# Patient Record
Sex: Female | Born: 1975 | Hispanic: Yes | Marital: Married | Smoking: Never smoker
Health system: Southern US, Community
[De-identification: ages and names within clinical notes are randomized; demographics above are authoritative.]

## PROBLEM LIST (undated history)

## (undated) DIAGNOSIS — Z789 Other specified health status: Secondary | ICD-10-CM

## (undated) HISTORY — PX: NO PAST SURGERIES: SHX2092

## (undated) HISTORY — PX: OTHER SURGICAL HISTORY: SHX169

---

## 2019-04-04 ENCOUNTER — Inpatient Hospital Stay (HOSPITAL_COMMUNITY): Payer: Self-pay

## 2019-04-04 ENCOUNTER — Other Ambulatory Visit: Payer: Self-pay

## 2019-04-04 ENCOUNTER — Inpatient Hospital Stay (HOSPITAL_COMMUNITY)
Admission: AD | Admit: 2019-04-04 | Discharge: 2019-04-05 | Disposition: A | Discharge status: Home / Self Care | Payer: Self-pay | Attending: Obstetrics and Gynecology | Admitting: Obstetrics and Gynecology

## 2019-04-04 ENCOUNTER — Encounter (HOSPITAL_COMMUNITY): Payer: Self-pay

## 2019-04-04 DIAGNOSIS — Z3A11 11 weeks gestation of pregnancy: Secondary | ICD-10-CM | POA: Insufficient documentation

## 2019-04-04 DIAGNOSIS — O4690 Antepartum hemorrhage, unspecified, unspecified trimester: Secondary | ICD-10-CM

## 2019-04-04 DIAGNOSIS — O209 Hemorrhage in early pregnancy, unspecified: Secondary | ICD-10-CM

## 2019-04-04 DIAGNOSIS — O09522 Supervision of elderly multigravida, second trimester: Secondary | ICD-10-CM | POA: Insufficient documentation

## 2019-04-04 DIAGNOSIS — O4691 Antepartum hemorrhage, unspecified, first trimester: Secondary | ICD-10-CM

## 2019-04-04 DIAGNOSIS — O200 Threatened abortion: Secondary | ICD-10-CM | POA: Insufficient documentation

## 2019-04-04 DIAGNOSIS — O3671X0 Maternal care for viable fetus in abdominal pregnancy, first trimester, not applicable or unspecified: Secondary | ICD-10-CM

## 2019-04-04 DIAGNOSIS — O3680X0 Pregnancy with inconclusive fetal viability, not applicable or unspecified: Secondary | ICD-10-CM

## 2019-04-04 HISTORY — DX: Other specified health status: Z78.9

## 2019-04-04 LAB — CBC
HCT: 36.8 % (ref 36.0–46.0)
Hemoglobin: 12.3 g/dL (ref 12.0–15.0)
MCH: 29.2 pg (ref 26.0–34.0)
MCHC: 33.4 g/dL (ref 30.0–36.0)
MCV: 87.4 fL (ref 80.0–100.0)
Platelets: 331 10*3/uL (ref 150–400)
RBC: 4.21 MIL/uL (ref 3.87–5.11)
RDW: 13.1 % (ref 11.5–15.5)
WBC: 10.1 10*3/uL (ref 4.0–10.5)
nRBC: 0 % (ref 0.0–0.2)

## 2019-04-04 LAB — WET PREP, GENITAL
Clue Cells Wet Prep HPF POC: NONE SEEN
Sperm: NONE SEEN
Trich, Wet Prep: NONE SEEN
Yeast Wet Prep HPF POC: NONE SEEN

## 2019-04-04 LAB — I-STAT BETA HCG BLOOD, ED (MC, WL, AP ONLY): I-stat hCG, quantitative: >2000 m[IU]/mL — ABNORMAL HIGH (ref <5)

## 2019-04-04 LAB — HCG, QUANTITATIVE, PREGNANCY: hCG, Beta Chain, Quant, S: 10354 m[IU]/mL — ABNORMAL HIGH (ref <5)

## 2019-04-04 LAB — HIV ANTIBODY (ROUTINE TESTING W REFLEX): HIV Screen 4th Generation wRfx: NONREACTIVE

## 2019-04-04 LAB — ABO/RH: ABO/RH(D): O POS

## 2019-04-04 MED ORDER — ACETAMINOPHEN 325 MG PO TABS: 650.0000 mg | ORAL_TABLET | Freq: Once | ORAL | Status: DC

## 2019-04-04 NOTE — Discharge Instructions (Signed)
Amenaza de aborto °Threatened Miscarriage ° °La amenaza de aborto se produce cuando una mujer tiene hemorragia vaginal durante las primeras 20 semanas de embarazo, pero el embarazo no se interrumpe. Si durante este período usted tiene hemorragia vaginal, el médico le hará pruebas para asegurarse de que el embarazo continúe. Si las pruebas muestran que usted continúa embarazada y que el "bebé" en desarrollo (feto) dentro del útero sigue creciendo, se considera que tuvo una amenaza de aborto. °La amenaza de aborto no implica que el embarazo vaya a terminar, pero sí aumenta el riesgo de perder el embarazo (aborto completo). °¿Cuáles son las causas? °Por lo general, se desconoce la causa de esta afección. Si el resultado final es el aborto completo, la causa más frecuente es la cantidad anormal de cromosomas del feto. Los cromosomas son las estructuras internas de las células que contienen todo el material genético de una persona. °¿Qué incrementa el riesgo? °Los siguientes factores del estilo de vida pueden aumentar el riesgo de aborto al comienzo del embarazo: °· Fumar. °· El consumo de cantidades excesivas de alcohol o cafeína. °· El consumo de drogas. °Las siguientes enfermedades preexistentes pueden aumentar el riesgo de aborto al comienzo del embarazo: °· Síndrome del ovario poliquístico. °· Fibromas uterinos. °· Infecciones. °· Diabetes mellitus. °¿Cuáles son los signos o los síntomas? °Los síntomas de esta afección incluyen los siguientes: °· Hemorragia vaginal. °· Dolor o cólicos abdominales leves. °¿Cómo se diagnostica? °Si tiene hemorragia con o sin dolor abdominal antes de las 20 semanas de embarazo, el médico le hará pruebas para determinar si el embarazo continúa. Estas incluirán lo siguiente: °· Ecografía. Este estudio usa ondas sonoras para crear imágenes del interior del útero. Esto permite que el médico vea al bebé en gestación y otras estructuras, como la placenta. °· Examen pélvico. Este es un examen  interno de la vagina y del cuello uterino. °· Medición de la frecuencia cardíaca del feto. °· Pruebas de laboratorio, como análisis de sangre, análisis de orina o hisopados para detectar una infección. °Es posible que le diagnostiquen una amenaza de aborto en los siguientes casos: °· La ecografía muestra que el embarazo continúa. °· La frecuencia cardíaca del feto es alta. °· El examen pélvico muestra que la apertura entre el útero y la vagina (cuello uterino) está cerrada. °· Los análisis de sangre confirman que el embarazo continúa. °¿Cómo se trata? °No se ha demostrado que ningún tratamiento evite que una amenaza de aborto se convierta en un aborto completo. Sin embargo, los cuidados adecuados en el hogar son importantes. °Siga estas indicaciones en su casa: °· Descanse lo suficiente. °· No tenga relaciones sexuales ni use tampones si tiene hemorragia vaginal. °· No se haga duchas vaginales. °· No fume ni consuma drogas. °· No beba alcohol. °· Evite la cafeína. °· Vaya a todas las visitas de control prenatales y de control como se lo haya indicado el médico. Esto es importante. °Comuníquese con un médico si: °· Tiene una ligera hemorragia o manchado vaginal durante el embarazo. °· Tiene dolor o cólicos en el abdomen. °· Tiene fiebre. °Solicite ayuda de inmediato si: °· Tiene una hemorragia vaginal abundante. °· Elimina coágulos de sangre por la vagina. °· Elimina tejidos por la vagina. °· Tiene una pérdida de líquido o le sale líquido a chorros por la vagina. °· Siente dolor en la parte baja de la espalda o cólicos abdominales intensos. °· Tiene fiebre, escalofríos y dolor abdominal intenso. °Resumen °· La amenaza de aborto se produce cuando una mujer tiene hemorragia   vaginal durante las primeras 20semanas de [place], pero el embarazo no se interrumpe.  Por lo general, no se conoce la causa de la amenaza de aborto.  Entre los sntomas de esta afeccin, se incluyen hemorragia vaginal y clicos o dolor  abdominal leve.  No se ha demostrado que ningn tratamiento evite que una amenaza de aborto se [place] en un aborto completo.  Vaya a todas las visitas de control prenatales y de control como se lo haya indicado el mdico. Esto es importante. Esta informacin no tiene Marine scientist el consejo del mdico. Asegrese de hacerle al mdico cualquier pregunta que tenga. Document Released: 01/25/2005 Document Revised: 01/05/2017 Document Reviewed: 01/05/2017 Elsevier Patient Education  2020 [employer].

## 2019-04-04 NOTE — MAU Provider Note (Signed)
History     CSN: [id number]  Arrival date and time: 04/04/19 1750   First Provider Initiated Contact with Patient 04/04/19 2056      Chief Complaint  Patient presents with  . Vaginal Bleeding   Tammy Underwood is a 43 y.o. G6P5005 at 11 weeks by Definite LMP of Sept 18, 2020.  She presents today for Vaginal Bleeding that started around 5pm.  She states it is not a lot of bleeding, but it is bright red and without clots.  She endorses lower abdominal pain that is intermittent in nature.  She endorses having a positive pregnancy test at home yesterday. She denies sexual activity in the last 3 days or vaginal discharge prior to the bleeding.      OB History    Gravida  6   Para  5   Term  5   Preterm  0   AB  0   Living  5     SAB  0   TAB  0   Ectopic  0   Multiple  0   Live Births  5           Past Medical History:  Diagnosis Date  . Medical history non-contributory     Past Surgical History:  Procedure Laterality Date  . NO PAST SURGERIES      No family history on file.  Social History   Tobacco Use  . Smoking status: Never Smoker  . Smokeless tobacco: Never Used  Substance Use Topics  . Alcohol use: Never    Frequency: Never  . Drug use: Never    Allergies: No Known Allergies  Medications Prior to Admission  Medication Sig Dispense Refill Last Dose  . Prenatal Vit-Fe Fumarate-FA (PRENATAL MULTIVITAMIN) TABS tablet Take 1 tablet by mouth daily at 12 noon.   04/04/2019 at Unknown time    Review of Systems  Constitutional: Negative for chills and fever.  Respiratory: Negative for cough and shortness of breath.   Gastrointestinal: Positive for abdominal pain. Negative for constipation, diarrhea, nausea and vomiting.  Genitourinary: Positive for vaginal bleeding. Negative for difficulty urinating, dysuria, pelvic pain and vaginal discharge.  Musculoskeletal: Negative for back pain.  Neurological: Positive for headaches (5/10).  Negative for dizziness and light-headedness.   Physical Exam   Blood pressure 104/65, pulse 89, temperature 98.6 F (37 C), temperature source Oral, resp. rate 16, height 5' (1.524 m), weight 57 kg, last menstrual period 01/17/2019, SpO2 100 %.  Physical Exam  Vitals reviewed. Constitutional: She is oriented to person, place, and time. She appears well-developed and well-nourished.  HENT:  Head: Normocephalic and atraumatic.  Eyes: Conjunctivae are normal.  Neck: Normal range of motion.  Cardiovascular: Normal rate.  Respiratory: Effort normal.  GI: Soft.  Genitourinary:    Vaginal bleeding present.  There is bleeding in the vagina.    Genitourinary Comments: Speculum Exam: -Normal External Genitalia: Non tender, Blood noted on labia and perineum.  Active bleeding from introitus noted -Vaginal Vault:  Abundant amt of large (lemon sized) clots removed with forceps and with patient performing valsalva maneuver x 3. Allowing for visualizing of Pink mucosa with some notable vaginal wall prolapse-wet prep collected -Cervix:Pink, no lesions, cysts, or polyps.  Appears slightly open. Scant amt active bleeding from os-GC/CT collected -Bimanual Exam:  Deferred    Musculoskeletal: Normal range of motion.        General: No edema.  Neurological: She is alert and oriented to person, place, and time.  Skin: Skin is warm and dry.  Psychiatric: She has a normal mood and affect. Her behavior is normal.    MAU Course  Procedures Results for orders placed or performed during the hospital encounter of 04/04/19 (from the past 24 hour(s))  I-Stat Beta hCG blood, ED ([facility], [facility], [facility] only)     Status: Abnormal   Collection Time: 04/04/19  6:53 PM  Result Value Ref Range   I-stat hCG, quantitative >2,000.0 (H) <5 mIU/mL   Comment 3          hCG, quantitative, pregnancy     Status: Abnormal   Collection Time: 04/04/19  8:42 PM  Result Value Ref Range   hCG, Beta Chain, Quant, S 10,354 (H) <5 mIU/mL   CBC     Status: None   Collection Time: 04/04/19  8:42 PM  Result Value Ref Range   WBC 10.1 4.0 - 10.5 K/uL   RBC 4.21 3.87 - 5.11 MIL/uL   Hemoglobin 12.3 12.0 - 15.0 g/dL   HCT 38.9 37.3 - 42.8 %   MCV 87.4 80.0 - 100.0 fL   MCH 29.2 26.0 - 34.0 pg   MCHC 33.4 30.0 - 36.0 g/dL   RDW 76.8 11.5 - 72.6 %   Platelets 331 150 - 400 K/uL   nRBC 0.0 0.0 - 0.2 %  ABO/Rh     Status: None   Collection Time: 04/04/19  8:42 PM  Result Value Ref Range   ABO/RH(D) O POS    No rh immune globuloin      NOT A RH IMMUNE GLOBULIN CANDIDATE, PT RH POSITIVE Performed at [facility], [address], [place], [place] [address]   HIV Antibody (routine testing w rflx)     Status: None   Collection Time: 04/04/19  8:42 PM  Result Value Ref Range   HIV Screen 4th Generation wRfx NON REACTIVE NON REACTIVE  Wet prep, genital     Status: Abnormal   Collection Time: 04/04/19  9:24 PM  Result Value Ref Range   Yeast Wet Prep HPF POC NONE SEEN NONE SEEN   Trich, Wet Prep NONE SEEN NONE SEEN   Clue Cells Wet Prep HPF POC NONE SEEN NONE SEEN   WBC, Wet Prep HPF POC FEW (A) NONE SEEN   Sperm NONE SEEN    US Ob Less Than 14 Weeks With Ob Transvaginal  Result Date: 04/04/2019 CLINICAL DATA:  Initial evaluation for acute vaginal bleeding, early pregnancy. EXAM: OBSTETRIC <14 WK [place] AND TRANSVAGINAL OB US TECHNIQUE: Both transabdominal and transvaginal ultrasound examinations were performed for complete evaluation of the gestation as well as the maternal uterus, adnexal regions, and pelvic cul-de-sac. Transvaginal technique was performed to assess early pregnancy. COMPARISON:  None. FINDINGS: Intrauterine gestational sac: Present. Yolk sac:  Present. Embryo:  Present. Cardiac Activity: Negative. Heart Rate: N/A  bpm CRL: 3.74 mm   6 w   0 d                  [place] EDC: 11/28/2019 Subchorionic hemorrhage:  None visualized. Maternal uterus/adnexae: Ovaries are normal in appearance bilaterally. Corpus luteal cyst  noted on the left. No adnexal mass or free fluid. IMPRESSION: 1. Single intrauterine gestational sac with internal yolk sac and embryo, but no cardiac activity yet visualized. Crown-rump length measures 3.7 mm. Findings are suspicious but not yet definitive for failed pregnancy. Recommend follow-up US in 10-14 days for definitive diagnosis. This recommendation follows SRU consensus guidelines: Diagnostic Criteria for  Nonviable Pregnancy Early in the First Trimester. Malva Limes Engl J Med 2013; 161:0960-45; 369:1443-51. 2. No other acute maternal uterine or adnexal abnormality identified. Electronically Signed   By: Rise MuBenjamin  McClintock M.D.   On: 04/04/2019 22:56    MDM Pelvic Exam; Wet Prep and GC/CT Labs: UA, UPT, CBC, hCG, ABO Ultrasound Pain Medication Assessment and Plan  43 year old G6P5005 at 11 weeks Vaginal Bleeding Headache  -Reviewed POC with patient. -Exam performed and findings discussed.  -Informed that several large clots removed from vagina and one area that is suspicious for POC.  -Contents sent to pathology for formal evaluation. -Offered and accepts pain medication for HA.  Will give tylenol now. -Patient to US to evaluate uterine contents. -Will reassess once final results return.  -Interpretations completed with assistance of in-house interpreter-Viria Onalee HuaAlvarez.    Cherre RobinsJessica L Laveyah Oriol, MSN, CNM 04/04/2019, 8:56 PM   Reassessment (11:29 PM) SIUP at 6.0 without Cardiac Activity Threatened Abortion  -US results reveal IUP without cardiac activity. -Wet prep negative. -Surgical Pathology evaluation cancelled.  -In room to discuss results with patient. -Patient informed that findings not c/w dating based on definite LMP.  -Patient questions if fetus remains in place and informed that embryo seen, but bleeding is suspicious for threatened abortion.  -Discussed need for follow up US in one week to determine fetal viability.  -Bleeding Precautions given. -Patient instructed to take tylenol for  pain if needed. -Instructed to cancel scheduled appt for [facility] on Monday until follow up ultrasound is performed.  -Patient verbalizes understanding and had no questions or concerns. -US ordered. -Encouraged to call or return to MAU if symptoms worsen or with the onset of new symptoms. -Discharged to home in stable condition. -Interpretatations completed with assistance of [place] [phone].  Cherre RobinsJessica L Bevin Mayall MSN, CNM Advanced Practice Provider, [facility]

## 2019-04-04 NOTE — MAU Note (Signed)
Pt transferred from ED. Pt reports she started having vaginal bleeding today around 1730 with lower abdominal pain. No clots or tissue. Has changed pad only 1 time. Has not taken anything for pain. No recent intercourse or vaginal exams. Has appointment on Monday with [facility]. LMP 01/17/2019. In waiting room, patient sitting on chux pad with some bright red vaginal blood noted.

## 2019-04-07 LAB — GC/CHLAMYDIA PROBE AMP (~~LOC~~) NOT AT ARMC
Chlamydia: NEGATIVE
Comment: NEGATIVE
Comment: NORMAL
Neisseria Gonorrhea: NEGATIVE

## 2019-04-14 ENCOUNTER — Ambulatory Visit (INDEPENDENT_AMBULATORY_CARE_PROVIDER_SITE_OTHER): Payer: Self-pay | Admitting: Obstetrics and Gynecology

## 2019-04-14 ENCOUNTER — Other Ambulatory Visit: Payer: Self-pay

## 2019-04-14 ENCOUNTER — Ambulatory Visit (HOSPITAL_COMMUNITY)
Admission: RE | Admit: 2019-04-14 | Discharge: 2019-04-14 | Disposition: A | Discharge status: Home / Self Care | Payer: Self-pay | Source: Ambulatory Visit

## 2019-04-14 ENCOUNTER — Encounter: Payer: Self-pay | Admitting: Obstetrics and Gynecology

## 2019-04-14 DIAGNOSIS — O09521 Supervision of elderly multigravida, first trimester: Secondary | ICD-10-CM

## 2019-04-14 DIAGNOSIS — O3680X0 Pregnancy with inconclusive fetal viability, not applicable or unspecified: Secondary | ICD-10-CM | POA: Insufficient documentation

## 2019-04-14 DIAGNOSIS — O021 Missed abortion: Secondary | ICD-10-CM | POA: Insufficient documentation

## 2019-04-14 DIAGNOSIS — Z789 Other specified health status: Secondary | ICD-10-CM

## 2019-04-14 DIAGNOSIS — O039 Complete or unspecified spontaneous abortion without complication: Secondary | ICD-10-CM

## 2019-04-14 DIAGNOSIS — Z5189 Encounter for other specified aftercare: Secondary | ICD-10-CM

## 2019-04-14 MED ORDER — WOMENS MULTI PO CAPS: 1.0000 | ORAL_CAPSULE | Freq: Every day | ORAL | Status: AC

## 2019-04-14 NOTE — Progress Notes (Signed)
Obstetrics and Gynecology Visit Return Patient Evaluation  Appointment Date: 04/14/2019  Primary Care Provider: Patient, No Pcp Per  OBGYN Clinic: Center for [facility]  Chief Complaint: follow up SAB  History of Present Illness:  Tammy Underwood is a 43 y.o. [phone] with above CC. Patient seen in MAU for VB on 12/4. Quant was 10,354 and u/s showed YS with CRL 3.41mm but no FHR; pt is O POS   U/s today showed nothing in uterus. She does states her bleeding stopped about 2 days ago and she did have to wear a pad or tampon until bleeding stopped.   Review of Systems:  as noted in the History of Present Illness.  Medications: None Allergies: has No Known Allergies.  Physical Exam:  LMP 01/17/2019  There is no height or weight on file to calculate BMI. General appearance: Well nourished, well developed female in no acute distress.  Neuro/Psych:  Normal mood and affect.    CLINICAL DATA:  Pregnancy of inconclusive fetal viability  EXAM: TRANSVAGINAL OB ULTRASOUND  TECHNIQUE: Transvaginal ultrasound was performed for complete evaluation of the gestation as well as the maternal uterus, adnexal regions, and pelvic cul-de-sac.  COMPARISON:  04/04/2019  FINDINGS: Intrauterine gestational sac: Absent; gestational sac seen on the previous exam no longer identified.  Yolk sac:  N/A  Embryo:  N/A  Cardiac Activity: N/A  Heart Rate: N/A bpm  MSD:   mm    w     d  CRL:     mm    w  d                  [place] EDC:  Subchorionic hemorrhage:  N/A  Maternal uterus/adnexae:  Previously identified gestational sac no longer seen.  Small nabothian cysts at cervix.  Minimal fluid within endometrial canal, which is otherwise normal in appearance.  Anteverted uterus without mass.  LEFT ovary normal size and morphology, 4.0 x 1.7 x 1.9 cm, demonstrating a dominant follicle.  RIGHT ovary measures 2.1 x 2.7 x 2.1 cm, normal appearance.  No free  pelvic fluid or adnexal masses.  IMPRESSION: Previously identified gestational sac is no longer seen consistent with spontaneous abortion.  Minimal nonspecific fluid within endometrial canal.   Electronically Signed   By: Ulyses Southward M.D.   On: 04/14/2019 15:37  CLINICAL DATA:  Initial evaluation for acute vaginal bleeding, early pregnancy.  EXAM: OBSTETRIC <14 WK [place] AND TRANSVAGINAL OB US  TECHNIQUE: Both transabdominal and transvaginal ultrasound examinations were performed for complete evaluation of the gestation as well as the maternal uterus, adnexal regions, and pelvic cul-de-sac. Transvaginal technique was performed to assess early pregnancy.  COMPARISON:  None.  FINDINGS: Intrauterine gestational sac: Present.  Yolk sac:  Present.  Embryo:  Present.  Cardiac Activity: Negative.  Heart Rate: N/A  bpm  CRL: 3.74 mm   6 w   0 d                  [place] EDC: 11/28/2019  Subchorionic hemorrhage:  None visualized.  Maternal uterus/adnexae: Ovaries are normal in appearance bilaterally. Corpus luteal cyst noted on the left. No adnexal mass or free fluid.  IMPRESSION: 1. Single intrauterine gestational sac with internal yolk sac and embryo, but no cardiac activity yet visualized. Crown-rump length measures 3.7 mm. Findings are suspicious but not yet definitive for failed pregnancy. Recommend follow-up US in 10-14 days for definitive diagnosis. This recommendation follows SRU consensus guidelines: Diagnostic Criteria for Nonviable Pregnancy  Early in the First Trimester. [place] Med 2013; 947:0962-83. 2. No other acute maternal uterine or adnexal abnormality identified.   Electronically Signed   By: Jeannine Boga M.D.   On: 04/04/2019 22:56 Assessment: s/p SAB  Plan:  1. Multigravida of advanced maternal age in first trimester Pt unsure if wants to try again or wants BC. D/w her should expect a period in about 4-6wks and if not to  call us and I recommend condoms or waiting to try again until next period for easier dating. D/w her cause most likely is due to [place] no need to do anything for work up.   2. Follow-up visit after miscarriage  Interpreter used  RTC: PRN  Durene Romans MD Attending Center for [facility])

## 2020-07-13 ENCOUNTER — Encounter (HOSPITAL_COMMUNITY): Payer: Self-pay | Admitting: Emergency Medicine

## 2020-07-13 ENCOUNTER — Other Ambulatory Visit: Payer: Self-pay

## 2020-07-13 ENCOUNTER — Emergency Department (HOSPITAL_COMMUNITY): Payer: Self-pay

## 2020-07-13 ENCOUNTER — Emergency Department (HOSPITAL_COMMUNITY)
Admission: EM | Admit: 2020-07-13 | Discharge: 2020-07-13 | Disposition: A | Discharge status: Home / Self Care | Payer: Self-pay | Attending: Emergency Medicine | Admitting: Emergency Medicine

## 2020-07-13 DIAGNOSIS — W010XXA Fall on same level from slipping, tripping and stumbling without subsequent striking against object, initial encounter: Secondary | ICD-10-CM | POA: Insufficient documentation

## 2020-07-13 DIAGNOSIS — Y990 Civilian activity done for income or pay: Secondary | ICD-10-CM | POA: Insufficient documentation

## 2020-07-13 DIAGNOSIS — S52502A Unspecified fracture of the lower end of left radius, initial encounter for closed fracture: Secondary | ICD-10-CM | POA: Insufficient documentation

## 2020-07-13 NOTE — Discharge Instructions (Addendum)
Recomiendo una combinacin de tylenol e ibuprofeno para Physicist, medical. Puede tomar una dosis baja de ambos al [employer]. Recomiendo 325 mg de Tylenol combinados con 400 mg de ibuprofeno. Este es un Tylenol regular y dos ibuprofeno regulares. Puede tomar estos 2-3 veces al da para Agricultural consultant. Intente tomar estos medicamentos con una pequea cantidad de alimentos para Acupuncturist.  Adems, considere cremas tpicas para Psychologist, counselling Gel, BioFreeze o [place]. Tambin hay una crema para aliviar el dolor hecha por [place]. Debera poder encontrar todos estos en su farmacia local.  Aplique hielo en la mueca izquierda. Esto ayuda tanto con el dolor como con la hinchazn.  Mantenga la frula intacta en la mueca izquierda hasta que haga un seguimiento con el mdico de manos local. Su nmero de telfono est abajo. Su nombre es Dr. Amanda Pea.  Si sus sntomas empeoran, regrese al departamento de emergencias. Fue Curator.

## 2020-07-13 NOTE — Progress Notes (Signed)
Orthopedic Tech Progress Note Patient Details:  Tammy Underwood 1975/07/14 [id number]  Ortho Devices Type of Ortho Device: Sugartong splint,Arm sling Ortho Device/Splint Location: LUE Ortho Device/Splint Interventions: Ordered,Application   Post Interventions Patient Tolerated: Well Instructions Provided: Care of device   Donald Pore 07/13/2020, 7:27 PM

## 2020-07-13 NOTE — ED Triage Notes (Signed)
Pt slipped and fell at work around 2:30 pm.  Reports L wrist pain.  CMS intact. Went home and took ibuprofen and made a sling for her arm.  Pain hasn't improved.

## 2020-07-13 NOTE — ED Notes (Signed)
Patient verbalized understanding of discharge instructions. Opportunity for questions and answers.  

## 2020-07-13 NOTE — ED Provider Notes (Signed)
[facility] EMERGENCY DEPARTMENT Provider Note   CSN: [id number] Arrival date & time: 07/13/20  1655     History Chief Complaint  Patient presents with  . Wrist Pain    Tammy Underwood is a 45 y.o. female.  HPI Patient is a 45 year old female who presents the emergency department due to left wrist pain.  She is left-hand dominant.  History obtained Via Spanish interpreter.  Patient states she was at work 2 days ago and slipped and fell on an outstretched left hand.  She reports mild wrist pain that has worsened with associated moderate swelling circumferentially.  Pain worsens with any movement of the wrist and radiates down her hand as well as upper left arm.  She denies any hand pain, elbow pain, or shoulder pain.  Denies any numbness, tingling, or weakness.  She states she took ibuprofen at home and made a homemade sling for her arm.  No other complaints at this time.    Past Medical History:  Diagnosis Date  . Medical history non-contributory     Patient Active Problem List   Diagnosis Date Noted  . Missed ab 04/14/2019  . Multigravida of advanced maternal age in first trimester 04/14/2019    Past Surgical History:  Procedure Laterality Date  . NO PAST SURGERIES       OB History    Gravida  6   Para  5   Term  5   Preterm  0   AB  1   Living  5     SAB  1   IAB  0   Ectopic  0   Multiple  0   Live Births  5           No family history on file.  Social History   Tobacco Use  . Smoking status: Never Smoker  . Smokeless tobacco: Never Used  Substance Use Topics  . Alcohol use: Never  . Drug use: Never    Home Medications Prior to Admission medications   Medication Sig Start Date End Date Taking? Authorizing Provider  Multiple Vitamins-Minerals (WOMENS MULTI) CAPS Take 1 capsule by mouth daily. 04/14/19   Seward Bing, MD  Prenatal Vit-Fe Fumarate-FA (PRENATAL MULTIVITAMIN) TABS tablet Take 1 tablet by mouth  daily at 12 noon.    [provider]    Allergies    Patient has no known allergies.  Review of Systems   Review of Systems  All other systems reviewed and are negative. Ten systems reviewed and are negative for acute change, except as noted in the HPI.   Physical Exam Updated Vital Signs BP 118/89 (BP Location: Right Arm)   Pulse 81   Temp 99.1 F (37.3 C)   Resp 16   LMP 06/16/2020   SpO2 100%   Physical Exam Vitals and nursing note reviewed.  Constitutional:      General: She is not in acute distress.    Appearance: Normal appearance. She is not ill-appearing, toxic-appearing or diaphoretic.  HENT:     Head: Normocephalic and atraumatic.     Right Ear: External ear normal.     Left Ear: External ear normal.     Nose: Nose normal.     Mouth/Throat:     Mouth: Mucous membranes are moist.     Pharynx: Oropharynx is clear. No oropharyngeal exudate or posterior oropharyngeal erythema.  Eyes:     Extraocular Movements: Extraocular movements intact.  Cardiovascular:     Rate  and Rhythm: Normal rate and regular rhythm.     Pulses: Normal pulses.     Heart sounds: No gallop.   Pulmonary:     Effort: Pulmonary effort is normal.  Abdominal:     General: Abdomen is flat. There is no distension.  Musculoskeletal:        General: Swelling and tenderness present. Normal range of motion.     Cervical back: Normal range of motion and neck supple. No tenderness.     Comments: Moderate tenderness noted along the volar aspect of the left wrist.  Mild edema noted circumferentially around the left wrist.  Unable to assess range of motion due to pain.  Patient has full range of motion of all the fingers of the left hand, left elbow, as well as the left shoulder.  Distal sensation intact.  Good cap refill.  2+ radial pulses.  Soft compartments.  Skin:    General: Skin is warm and dry.  Neurological:     General: No focal deficit present.     Mental Status: She is alert and  oriented to person, place, and time.  Psychiatric:        Mood and Affect: Mood normal.        Behavior: Behavior normal.    ED Results / Procedures / Treatments   Labs (all labs ordered are listed, but only abnormal results are displayed) Labs Reviewed - No data to display  EKG None  Radiology DG Wrist Complete Left  Result Date: 07/13/2020 CLINICAL DATA:  Status post fall. EXAM: LEFT WRIST - COMPLETE 3+ VIEW COMPARISON:  None. FINDINGS: Acute fracture is seen involving the distal left radius. There is no evidence of dislocation. There is no evidence of arthropathy or other focal bone abnormality. Mild diffuse soft tissue swelling is seen. IMPRESSION: Acute fracture of the distal left radius. Electronically Signed   By: Aram Candela M.D.   On: 07/13/2020 17:53    Procedures Procedures   Medications Ordered in ED Medications - No data to display  ED Course  I have reviewed the triage vital signs and the nursing notes.  Pertinent labs & imaging results that were available during my care of the patient were reviewed by me and considered in my medical decision making (see chart for details).    MDM Rules/Calculators/A&P                          Pt is a 45 y.o. female who presents the emergency department due to a fracture of the left wrist.  Imaging: X-ray of the left wrist shows acute fracture of the distal left radius.  I, Placido Sou, PA-C, personally reviewed and evaluated these images and lab results as part of my medical decision-making.  Physical exam is reassuring.  She is neurovascularly intact in the left hand.  Full range of motion of the left hand, left elbow, as well as left shoulder.  Grip strength is intact.  Strong radial pulses.  Soft compartments.  Will place patient in a sugar tong splint, sling, and give her follow-up with hand surgery.  Recommended that she call them tomorrow to schedule a follow-up appointment.  We discussed return precautions.   Tylenol and ibuprofen for continued pain management.  Her questions were answered and she was amicable at the time of discharge.  Note: Portions of this report may have been transcribed using voice recognition software. Every effort was made to ensure accuracy; however, inadvertent  computerized transcription errors may be present.   Final Clinical Impression(s) / ED Diagnoses Final diagnoses:  Closed fracture of distal end of left radius, unspecified fracture morphology, initial encounter   Rx / DC Orders ED Discharge Orders    None       Placido Sou, PA-C 07/13/20 1829    Milagros Loll, MD 07/16/20 [phone]

## 2021-02-08 ENCOUNTER — Other Ambulatory Visit: Payer: Self-pay | Admitting: Obstetrics & Gynecology

## 2021-02-08 DIAGNOSIS — Z1231 Encounter for screening mammogram for malignant neoplasm of breast: Secondary | ICD-10-CM

## 2021-02-24 ENCOUNTER — Inpatient Hospital Stay: Admission: RE | Admit: 2021-02-24 | Payer: Self-pay | Source: Ambulatory Visit

## 2021-02-24 ENCOUNTER — Ambulatory Visit (INDEPENDENT_AMBULATORY_CARE_PROVIDER_SITE_OTHER): Payer: Self-pay

## 2021-02-24 ENCOUNTER — Other Ambulatory Visit: Payer: Self-pay

## 2021-02-24 ENCOUNTER — Ambulatory Visit
Admission: RE | Admit: 2021-02-24 | Discharge: 2021-02-24 | Disposition: A | Discharge status: Home / Self Care | Payer: No Typology Code available for payment source | Source: Ambulatory Visit | Attending: Obstetrics & Gynecology | Admitting: Obstetrics & Gynecology

## 2021-02-24 DIAGNOSIS — Z3202 Encounter for pregnancy test, result negative: Secondary | ICD-10-CM

## 2021-02-24 DIAGNOSIS — Z1231 Encounter for screening mammogram for malignant neoplasm of breast: Secondary | ICD-10-CM

## 2021-02-25 LAB — POCT PREGNANCY, URINE: Preg Test, Ur: NEGATIVE

## 2022-04-15 IMAGING — CR DG WRIST COMPLETE 3+V*L*
4 series · 4 of 4 positions shown · non-contrast
Comparison: None.

CLINICAL DATA: Status post fall.

EXAM:
LEFT WRIST - COMPLETE 3+ VIEW

[wrist pa]
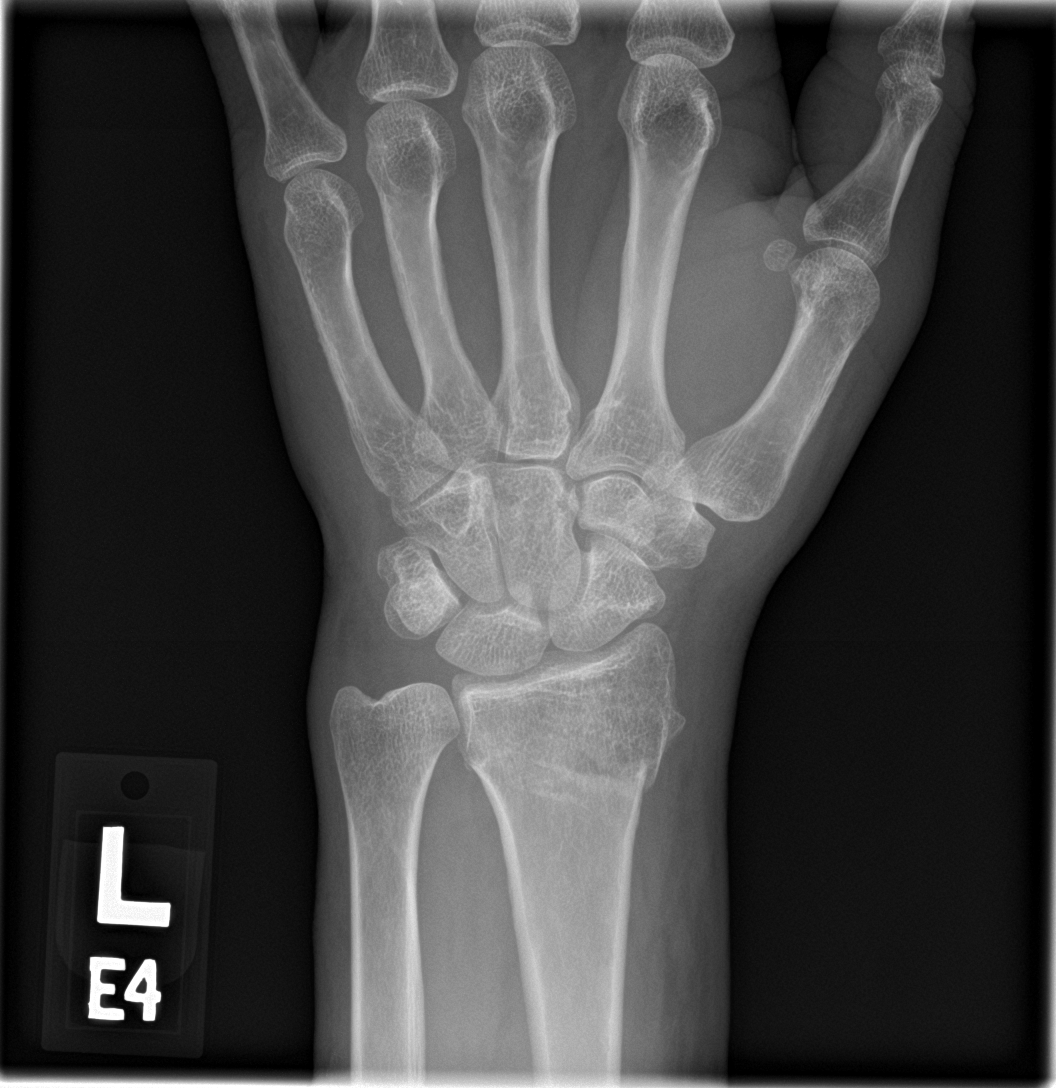

[wrist obl]
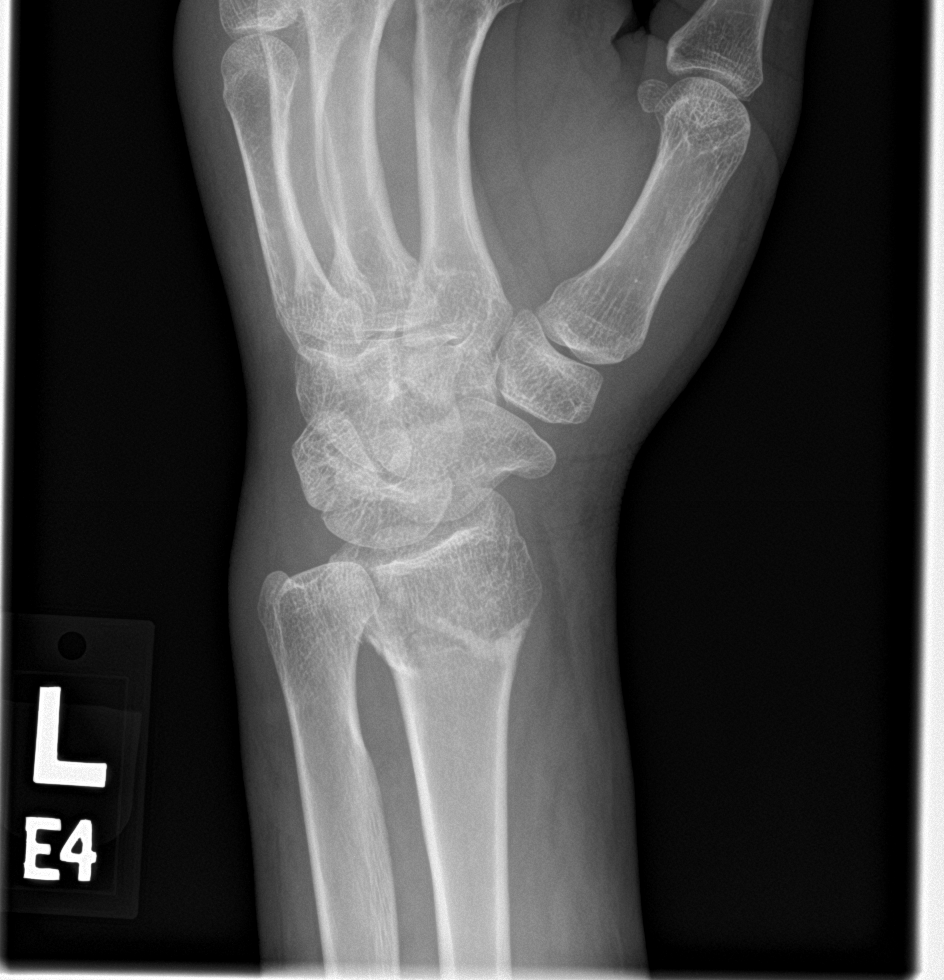

[wrist lat]
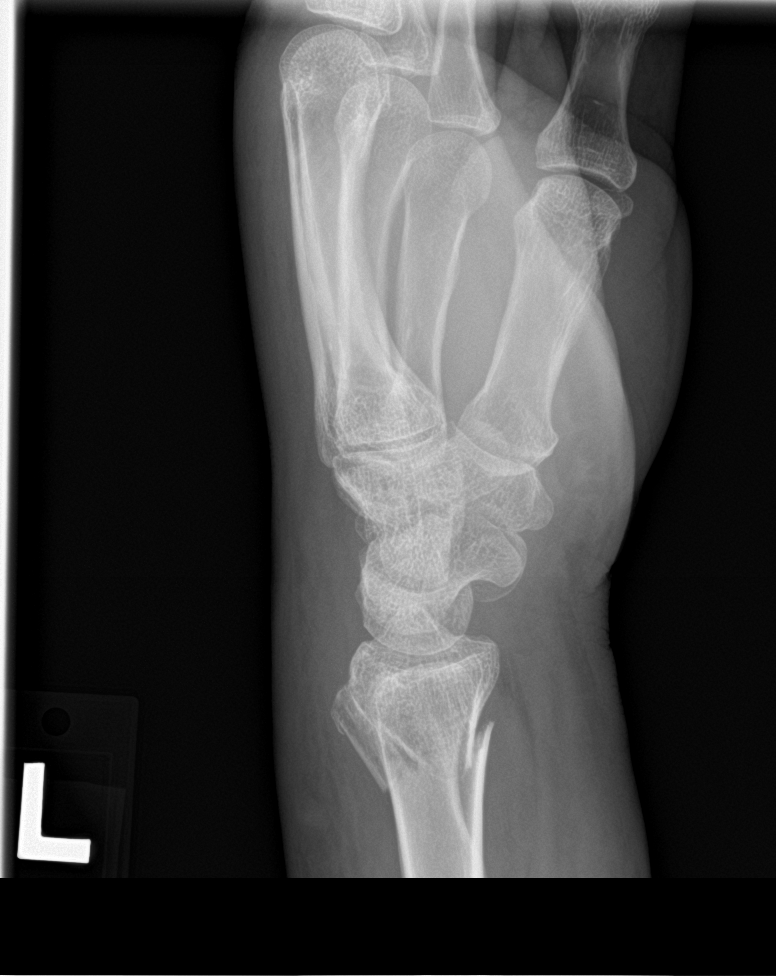

[wrist navicular]
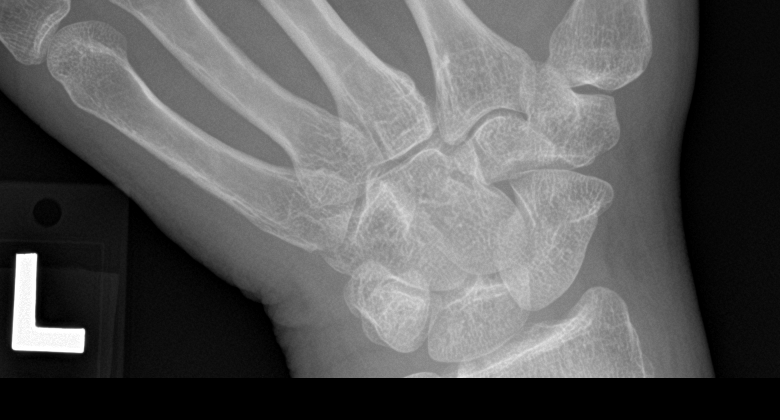

[4 of 4 positions shown; findings below may reference images not displayed]

FINDINGS: Acute fracture is seen involving the distal left radius. There is no
evidence of dislocation. There is no evidence of arthropathy or
other focal bone abnormality. Mild diffuse soft tissue swelling is
seen.
IMPRESSION: Acute fracture of the distal left radius.

## 2023-04-05 ENCOUNTER — Other Ambulatory Visit: Payer: Self-pay | Admitting: Obstetrics & Gynecology

## 2023-04-05 DIAGNOSIS — Z1231 Encounter for screening mammogram for malignant neoplasm of breast: Secondary | ICD-10-CM

## 2023-05-17 ENCOUNTER — Ambulatory Visit
Admission: RE | Admit: 2023-05-17 | Discharge: 2023-05-17 | Disposition: A | Discharge status: Home / Self Care | Payer: No Typology Code available for payment source | Source: Ambulatory Visit | Attending: Obstetrics & Gynecology | Admitting: Obstetrics & Gynecology

## 2023-05-17 DIAGNOSIS — Z1231 Encounter for screening mammogram for malignant neoplasm of breast: Secondary | ICD-10-CM

## 2023-06-03 ENCOUNTER — Other Ambulatory Visit: Payer: Self-pay

## 2023-06-03 ENCOUNTER — Emergency Department (HOSPITAL_COMMUNITY)
Admission: EM | Admit: 2023-06-03 | Discharge: 2023-06-03 | Disposition: A | Discharge status: Home / Self Care | Payer: No Typology Code available for payment source | Attending: Emergency Medicine | Admitting: Emergency Medicine

## 2023-06-03 ENCOUNTER — Encounter (HOSPITAL_COMMUNITY): Payer: Self-pay

## 2023-06-03 ENCOUNTER — Emergency Department (HOSPITAL_COMMUNITY): Payer: No Typology Code available for payment source

## 2023-06-03 DIAGNOSIS — M5441 Lumbago with sciatica, right side: Secondary | ICD-10-CM

## 2023-06-03 DIAGNOSIS — M51361 Other intervertebral disc degeneration, lumbar region with lower extremity pain only: Secondary | ICD-10-CM | POA: Insufficient documentation

## 2023-06-03 LAB — POC URINE PREG, ED: Preg Test, Ur: NEGATIVE

## 2023-06-03 MED ORDER — PREDNISONE 20 MG PO TABS
60.0000 mg | ORAL_TABLET | Freq: Once | ORAL | Status: AC
  Administered 2023-06-03: 60 mg via ORAL
  Filled 2023-06-03: qty 3

## 2023-06-03 MED ORDER — METHOCARBAMOL 750 MG PO TABS: 750.0000 mg | ORAL_TABLET | Freq: Three times a day (TID) | ORAL | 0 refills | Status: AC | PRN

## 2023-06-03 MED ORDER — PREDNISONE 20 MG PO TABS: ORAL_TABLET | ORAL | 0 refills | Status: AC

## 2023-06-03 MED ORDER — ACETAMINOPHEN 500 MG PO TABS
1000.0000 mg | ORAL_TABLET | Freq: Once | ORAL | Status: AC
  Administered 2023-06-03: 1000 mg via ORAL
  Filled 2023-06-03: qty 2

## 2023-06-03 NOTE — ED Provider Notes (Signed)
[place] EMERGENCY DEPARTMENT AT [facility] Provider Note   CSN: [id number] Arrival date & time: 06/03/23  4098     History  Chief Complaint  Patient presents with   Leg Pain    Tammy Underwood is a 48 y.o. female.  Pt with c/o low back pain on right side, that occasionally radiates down right leg. Symptoms present in past couple weeks. Denies hx same. No hx chronic back pain, or prior back surgery. No hx ddd. Denies specific injury or strain. No fever/chills. No saddle area numbness. When pain radiates to leg some tingling sensation. No weakness or loss of normal function. No problems w gait. No problems w bowel/bladder fxn. No neck or upper back pain. No 'anterior' pain, no abd pain or chest pain. No fevers.  Has not taken any medication for the pain.   The history is provided by the patient and medical records. A language interpreter was used (av interpreter service used).  Leg Pain Associated symptoms: back pain   Associated symptoms: no fever and no neck pain        Home Medications Prior to Admission medications   Medication Sig Start Date End Date Taking? Authorizing Provider  Multiple Vitamins-Minerals (WOMENS MULTI) CAPS Take 1 capsule by mouth daily. 04/14/19   [place] Bing, MD  Prenatal Vit-Fe Fumarate-FA (PRENATAL MULTIVITAMIN) TABS tablet Take 1 tablet by mouth daily at 12 noon.    [provider]      Allergies    Patient has no known allergies.    Review of Systems   Review of Systems  Constitutional:  Negative for fever.  Respiratory:  Negative for cough and shortness of breath.   Cardiovascular:  Negative for chest pain.  Gastrointestinal:  Negative for abdominal pain and vomiting.  Genitourinary:  Negative for difficulty urinating and dysuria.  Musculoskeletal:  Positive for back pain. Negative for neck pain.  Skin:  Negative for rash.  Neurological:  Negative for weakness and headaches.    Physical Exam Updated Vital  Signs BP 115/82 (BP Location: Right Arm)   Pulse 73   Temp 97.7 F (36.5 C) (Oral)   Resp 17   Ht 1.524 m (5')   Wt 63.5 kg   SpO2 100%   BMI 27.34 kg/m  Physical Exam Vitals and nursing note reviewed.  Constitutional:      Appearance: Normal appearance. She is well-developed.  HENT:     Head: Atraumatic.     Nose: Nose normal.     Mouth/Throat:     Mouth: Mucous membranes are moist.  Eyes:     General: No scleral icterus.    Conjunctiva/sclera: Conjunctivae normal.  Neck:     Trachea: No tracheal deviation.  Cardiovascular:     Rate and Rhythm: Normal rate and regular rhythm.     Pulses: Normal pulses.     Heart sounds: Normal heart sounds. No murmur heard.    No friction rub. No gallop.  Pulmonary:     Effort: Pulmonary effort is normal. No respiratory distress.     Breath sounds: Normal breath sounds.  Abdominal:     General: Bowel sounds are normal. There is no distension.     Palpations: Abdomen is soft. There is no mass.     Tenderness: There is no abdominal tenderness. There is no guarding.  Genitourinary:    Comments: No cva tenderness.  Musculoskeletal:        General: No swelling.     Cervical back:  Normal range of motion and neck supple. No rigidity. No muscular tenderness.     Comments: Right lumbar muscular tenderness. No sts or skin changes. CTLS spine, non tender, aligned, no step off. Good rom right hip and knee without pain. No RLE swelling. RLE is of normal color and warmth. Intact distal pulses RLE.   Skin:    General: Skin is warm and dry.     Findings: No rash.  Neurological:     Mental Status: She is alert.     Comments: Alert, speech normal. RLE motor/sens grossly intact. Stre 5/5. Ankle dorsi and plantar fxn normal, normal strength. Sens grossly intact. Straight leg raise neg.   Psychiatric:        Mood and Affect: Mood normal.     ED Results / Procedures / Treatments   Labs (all labs ordered are listed, but only abnormal results are  displayed) Results for orders placed or performed during the hospital encounter of 06/03/23  POC Urine Pregnancy, ED (not at [facility] or [facility])   Collection Time: 06/03/23  8:56 AM  Result Value Ref Range   Preg Test, Ur NEGATIVE NEGATIVE   DG Hip Unilat W or Wo Pelvis 2-3 Views Right Result Date: 06/03/2023 CLINICAL DATA:  Hip pain EXAM: DG HIP (WITH OR WITHOUT PELVIS) 2-3V RIGHT COMPARISON:  None Available. FINDINGS: There is no evidence of hip fracture or dislocation. There is no evidence of arthropathy or other focal bone abnormality. IMPRESSION: Negative. Electronically Signed   By: Duanne Guess D.O.   On: 06/03/2023 09:06   DG Lumbar Spine Complete Result Date: 06/03/2023 CLINICAL DATA:  Low back pain EXAM: LUMBAR SPINE - COMPLETE 4+ VIEW COMPARISON:  None Available. FINDINGS: There is no evidence of lumbar spine fracture. Alignment is normal. Minimal disc space narrowing and facet arthropathy at L4-5 and L5-S1. IMPRESSION: Minimal degenerative changes of the lower lumbar spine. No acute findings. Electronically Signed   By: Duanne Guess D.O.   On: 06/03/2023 09:06     EKG None  Radiology DG Hip Unilat W or Wo Pelvis 2-3 Views Right Result Date: 06/03/2023 CLINICAL DATA:  Hip pain EXAM: DG HIP (WITH OR WITHOUT PELVIS) 2-3V RIGHT COMPARISON:  None Available. FINDINGS: There is no evidence of hip fracture or dislocation. There is no evidence of arthropathy or other focal bone abnormality. IMPRESSION: Negative. Electronically Signed   By: Duanne Guess D.O.   On: 06/03/2023 09:06   DG Lumbar Spine Complete Result Date: 06/03/2023 CLINICAL DATA:  Low back pain EXAM: LUMBAR SPINE - COMPLETE 4+ VIEW COMPARISON:  None Available. FINDINGS: There is no evidence of lumbar spine fracture. Alignment is normal. Minimal disc space narrowing and facet arthropathy at L4-5 and L5-S1. IMPRESSION: Minimal degenerative changes of the lower lumbar spine. No acute findings. Electronically Signed   By:  Duanne Guess D.O.   On: 06/03/2023 09:06    Procedures Procedures    Medications Ordered in ED Medications  predniSONE (DELTASONE) tablet 60 mg (has no administration in time range)  acetaminophen (TYLENOL) tablet 1,000 mg (has no administration in time range)    ED Course/ Medical Decision Making/ A&P                                 Medical Decision Making Problems Addressed: Acute right-sided low back pain with right-sided sciatica: acute illness or injury with systemic symptoms that poses a threat to life or bodily  functions Degeneration of intervertebral disc of lumbar region with lower extremity pain: chronic illness or injury with exacerbation, progression, or side effects of treatment that poses a threat to life or bodily functions  Amount and/or Complexity of Data Reviewed External Data Reviewed: notes. Labs: ordered. Decision-making details documented in ED Course. Radiology: ordered and independent interpretation performed. Decision-making details documented in ED Course.  Risk OTC drugs. Prescription drug management.   Labs and imaging ordered.   Reviewed nursing notes and prior charts for additional history.   Labs reviewed/interpreted by me - preg neg.   Xrays reviewed/interpreted by me - degenerative changes, no fx.   No meds pta. Confirmed nkda.   Acetaminophen po. Prednisone po.   Confirmed pharmacy, rx sent.   Rec pcp f/u.  Return precautions provided.         Final Clinical Impression(s) / ED Diagnoses Final diagnoses:  None    Rx / DC Orders ED Discharge Orders     None         Cathren Laine, MD 06/03/23 [phone]

## 2023-06-03 NOTE — ED Provider Triage Note (Signed)
Emergency Medicine Provider Triage Evaluation Note  Tammy Underwood , a 48 y.o. female  was evaluated in triage.  Pt complains of right hip pain.  Patient reports pain for the past 2 weeks.  Started at her tailbone now is located at her right hip and radiates down the leg.  Any injuries prior to onset of symptoms.  Review of Systems  Positive: Low back and right hip pain Negative: Inability to walk  Physical Exam  BP 117/85 (BP Location: Right Arm)   Pulse 77   Temp 97.6 F (36.4 C)   Resp 16   Ht 5' (1.524 m)   Wt 63.5 kg   SpO2 93%   BMI 27.34 kg/m  Gen:   Awake, no distress   Resp:  Normal effort  MSK:   Moves extremities without difficulty  Other:  Tenderness to palpation of right lateral hip  Medical Decision Making  Medically screening exam initiated at 8:35 AM.  Appropriate orders placed.  Kennadee Dorlene Footman was informed that the remainder of the evaluation will be completed by another provider, this initial triage assessment does not replace that evaluation, and the importance of remaining in the ED until their evaluation is complete.    Maxwell Marion, PA-C 06/03/23 [phone]

## 2023-06-03 NOTE — Discharge Instructions (Addendum)
It was our pleasure to provide your ER care today - we hope that you feel better.  Avoid bending at waist or heavy lifting > 20 lbs for the next week.  Try gentle massage and/or heat  therapy to sore area. Take acetaminophen as need for pain. Take prednisone as prescribed. You may also take robaxin as need for muscle pain/spasm - no driving when taking.   Follow up with primary care doctor in the next 1-2 weeks if symptoms fail to improve/resolve.  Return to ER if worse, new symptoms, fevers, severe/intractable pain, numbness/weakness, or other concern.

## 2023-06-03 NOTE — ED Triage Notes (Signed)
Spanish interpreter used for triage:  Pt c.o right sided leg pain, states the pain started in her tailbone that now radiates to her groin/hip area down to her shin and . Describes the pain as a numbness/tingling. Denies recent injury/trauma.
